# Patient Record
Sex: Female | Born: 1965 | Race: White | Hispanic: No | Marital: Married | Smoking: Never smoker
Health system: Southern US, Community
[De-identification: ages and names within clinical notes are randomized; demographics above are authoritative.]

## PROBLEM LIST (undated history)

## (undated) DIAGNOSIS — F419 Anxiety disorder, unspecified: Secondary | ICD-10-CM

## (undated) DIAGNOSIS — T7840XA Allergy, unspecified, initial encounter: Secondary | ICD-10-CM

## (undated) DIAGNOSIS — T8859XA Other complications of anesthesia, initial encounter: Secondary | ICD-10-CM

## (undated) DIAGNOSIS — F329 Major depressive disorder, single episode, unspecified: Secondary | ICD-10-CM

## (undated) DIAGNOSIS — R112 Nausea with vomiting, unspecified: Secondary | ICD-10-CM

## (undated) DIAGNOSIS — J45909 Unspecified asthma, uncomplicated: Secondary | ICD-10-CM

## (undated) DIAGNOSIS — Z9889 Other specified postprocedural states: Secondary | ICD-10-CM

## (undated) DIAGNOSIS — R011 Cardiac murmur, unspecified: Secondary | ICD-10-CM

## (undated) DIAGNOSIS — F32A Depression, unspecified: Secondary | ICD-10-CM

## (undated) DIAGNOSIS — T4145XA Adverse effect of unspecified anesthetic, initial encounter: Secondary | ICD-10-CM

## (undated) HISTORY — DX: Allergy, unspecified, initial encounter: T78.40XA

## (undated) HISTORY — DX: Cardiac murmur, unspecified: R01.1

## (undated) HISTORY — DX: Major depressive disorder, single episode, unspecified: F32.9

## (undated) HISTORY — PX: EYE SURGERY: SHX253

## (undated) HISTORY — DX: Anxiety disorder, unspecified: F41.9

## (undated) HISTORY — DX: Unspecified asthma, uncomplicated: J45.909

## (undated) HISTORY — DX: Depression, unspecified: F32.A

## (undated) HISTORY — PX: APPENDECTOMY: SHX54

---

## 1999-04-15 ENCOUNTER — Other Ambulatory Visit: Admission: RE | Admit: 1999-04-15 | Discharge: 1999-04-15 | Payer: Self-pay | Admitting: Obstetrics & Gynecology

## 2000-05-19 ENCOUNTER — Other Ambulatory Visit: Admission: RE | Admit: 2000-05-19 | Discharge: 2000-05-19 | Payer: Self-pay | Admitting: Obstetrics & Gynecology

## 2001-06-09 ENCOUNTER — Other Ambulatory Visit: Admission: RE | Admit: 2001-06-09 | Discharge: 2001-06-09 | Payer: Self-pay | Admitting: Obstetrics & Gynecology

## 2002-06-14 ENCOUNTER — Other Ambulatory Visit: Admission: RE | Admit: 2002-06-14 | Discharge: 2002-06-14 | Payer: Self-pay | Admitting: Obstetrics & Gynecology

## 2003-06-20 ENCOUNTER — Other Ambulatory Visit: Admission: RE | Admit: 2003-06-20 | Discharge: 2003-06-20 | Payer: Self-pay | Admitting: Obstetrics & Gynecology

## 2004-06-30 ENCOUNTER — Other Ambulatory Visit: Admission: RE | Admit: 2004-06-30 | Discharge: 2004-06-30 | Payer: Self-pay | Admitting: Obstetrics & Gynecology

## 2004-08-15 ENCOUNTER — Ambulatory Visit (HOSPITAL_COMMUNITY): Admission: RE | Admit: 2004-08-15 | Discharge: 2004-08-15 | Payer: Self-pay | Admitting: Internal Medicine

## 2005-07-01 ENCOUNTER — Other Ambulatory Visit: Admission: RE | Admit: 2005-07-01 | Discharge: 2005-07-01 | Payer: Self-pay | Admitting: Obstetrics & Gynecology

## 2009-11-21 ENCOUNTER — Ambulatory Visit (HOSPITAL_COMMUNITY): Admission: RE | Admit: 2009-11-21 | Discharge: 2009-11-21 | Payer: Self-pay | Admitting: Internal Medicine

## 2012-08-21 ENCOUNTER — Emergency Department (HOSPITAL_COMMUNITY): Payer: No Typology Code available for payment source

## 2012-08-21 ENCOUNTER — Encounter (HOSPITAL_COMMUNITY): Payer: Self-pay | Admitting: Radiology

## 2012-08-21 ENCOUNTER — Emergency Department (HOSPITAL_COMMUNITY)
Admission: EM | Admit: 2012-08-21 | Discharge: 2012-08-21 | Disposition: A | Discharge status: Home / Self Care | Payer: No Typology Code available for payment source | Attending: Emergency Medicine | Admitting: Emergency Medicine

## 2012-08-21 DIAGNOSIS — S239XXA Sprain of unspecified parts of thorax, initial encounter: Secondary | ICD-10-CM

## 2012-08-21 DIAGNOSIS — R0602 Shortness of breath: Secondary | ICD-10-CM | POA: Insufficient documentation

## 2012-08-21 DIAGNOSIS — M549 Dorsalgia, unspecified: Secondary | ICD-10-CM | POA: Insufficient documentation

## 2012-08-21 LAB — URINALYSIS, ROUTINE W REFLEX MICROSCOPIC
Bilirubin Urine: NEGATIVE
Glucose, UA: NEGATIVE mg/dL
Hgb urine dipstick: NEGATIVE
Ketones, ur: 15 mg/dL — AB
Leukocytes, UA: NEGATIVE
Nitrite: NEGATIVE
Protein, ur: NEGATIVE mg/dL
Specific Gravity, Urine: 1.023 (ref 1.005–1.030)
Urobilinogen, UA: 1 mg/dL (ref 0.0–1.0)
pH: 6 (ref 5.0–8.0)

## 2012-08-21 MED ORDER — DIAZEPAM 5 MG PO TABS
5.0000 mg | ORAL_TABLET | Freq: Once | ORAL | Status: AC
  Administered 2012-08-21: 5 mg via ORAL
  Filled 2012-08-21: qty 1

## 2012-08-21 MED ORDER — HYDROCODONE-ACETAMINOPHEN 5-325 MG PO TABS
1.0000 | ORAL_TABLET | Freq: Once | ORAL | Status: AC
  Administered 2012-08-21: 1 via ORAL
  Filled 2012-08-21: qty 1

## 2012-08-21 MED ORDER — IBUPROFEN 600 MG PO TABS: 600.0000 mg | ORAL_TABLET | Freq: Three times a day (TID) | ORAL | Status: AC

## 2012-08-21 MED ORDER — HYDROCODONE-ACETAMINOPHEN 5-325 MG PO TABS: ORAL_TABLET | ORAL | Status: AC

## 2012-08-21 NOTE — Discharge Instructions (Signed)
 Thoracic Strain You have injured the muscles or tendons that attach to the upper part of your back behind your chest. This injury is called a thoracic strain, thoracic sprain, or mid-back strain.  CAUSES  The cause of thoracic strain varies. A less severe injury involves pulling a muscle or tendon without tearing it. A more severe injury involves tearing (rupturing) a muscle or tendon. With less severe injuries, there may be little loss of strength. Sometimes, there are breaks (fractures) in the bones to which the muscles are attached. These fractures are rare, unless there was a direct hit (trauma) or you have weak bones due to osteoporosis or age. Longstanding strains may be caused by overuse or improper form during certain movements. Obesity can also increase your risk for back injuries. Sudden strains may occur due to injury or not warming up properly before exercise. Often, there is no obvious cause for a thoracic strain. SYMPTOMS  The main symptom is pain, especially with movement, such as during exercise. DIAGNOSIS  Your caregiver can usually tell what is wrong by taking an X-ray and doing a physical exam. TREATMENT   Physical therapy may be helpful for recovery. Your caregiver can give you exercises to do or refer you to a physical therapist after your pain improves.  After your pain improves, strengthening and conditioning programs appropriate for your sport or occupation may be helpful.  Always warm up before physical activities or athletics. Stretching after physical activity may also help.  Certain over-the-counter medicines may also help. Ask your caregiver if there are medicines that would help you. If this is your first thoracic strain injury, proper care and proper healing time before starting activities should prevent long-term problems. Torn ligaments and tendons require as long to heal as broken bones. Average healing times may be only 1 week for a mild strain. For torn muscles  and tendons, healing time may be up to 6 weeks to 2 months. HOME CARE INSTRUCTIONS   Apply ice to the injured area. Ice massages may also be used as directed.  Put ice in a plastic bag.  Place a towel between your skin and the bag.  Leave the ice on for 15 to 20 minutes, 3 to 4 times a day, for the first 2 days.  Only take over-the-counter or prescription medicines for pain, discomfort, or fever as directed by your caregiver.  Keep your appointments for physical therapy if this was prescribed.  Use wraps and back braces as instructed. SEEK IMMEDIATE MEDICAL CARE IF:   You have an increase in bruising, swelling, or pain.  Your pain has not improved with medicines.  You develop new shortness of breath, chest pain, or fever.  Problems seem to be getting worse rather than better. MAKE SURE YOU:   Understand these instructions.  Will watch your condition.  Will get help right away if you are not doing well or get worse. Document Released: 01/09/2004 Document Revised: 01/11/2012 Document Reviewed: 12/05/2010 [facility] Patient Information 2013 [place], [place].   Narcotic and benzodiazepine use may cause drowsiness, slowed breathing or dependence.  Please use with caution and do not drive, operate machinery or watch young children alone while taking them.  Taking combinations of these medications or drinking alcohol will potentiate these effects.
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
## 2014-06-29 ENCOUNTER — Other Ambulatory Visit (HOSPITAL_COMMUNITY): Payer: Self-pay | Admitting: Internal Medicine

## 2014-06-29 ENCOUNTER — Ambulatory Visit (INDEPENDENT_AMBULATORY_CARE_PROVIDER_SITE_OTHER): Payer: BC Managed Care – PPO | Admitting: Internal Medicine

## 2014-06-29 ENCOUNTER — Ambulatory Visit (HOSPITAL_COMMUNITY)
Admission: RE | Admit: 2014-06-29 | Discharge: 2014-06-29 | Disposition: A | Discharge status: Home / Self Care | Payer: BC Managed Care – PPO | Source: Ambulatory Visit | Attending: Internal Medicine | Admitting: Internal Medicine

## 2014-06-29 ENCOUNTER — Ambulatory Visit (INDEPENDENT_AMBULATORY_CARE_PROVIDER_SITE_OTHER): Payer: BC Managed Care – PPO

## 2014-06-29 VITALS — BP 128/86 | HR 95 | Temp 98.8°F | Resp 16 | Ht 62.5 in | Wt 205.2 lb

## 2014-06-29 DIAGNOSIS — M79605 Pain in left leg: Secondary | ICD-10-CM

## 2014-06-29 DIAGNOSIS — M25562 Pain in left knee: Secondary | ICD-10-CM

## 2014-06-29 DIAGNOSIS — M25569 Pain in unspecified knee: Secondary | ICD-10-CM

## 2014-06-29 DIAGNOSIS — M79609 Pain in unspecified limb: Secondary | ICD-10-CM | POA: Insufficient documentation

## 2014-06-29 DIAGNOSIS — R609 Edema, unspecified: Secondary | ICD-10-CM

## 2014-06-29 MED ORDER — HYDROCODONE-ACETAMINOPHEN 5-325 MG PO TABS: 1.0000 | ORAL_TABLET | Freq: Four times a day (QID) | ORAL | Status: AC | PRN

## 2014-06-29 NOTE — Progress Notes (Signed)
VASCULAR LAB PRELIMINARY  PRELIMINARY  PRELIMINARY  PRELIMINARY  Left lower extremity venous duplex completed.    Preliminary report:  Left:  No evidence of DVT, superficial thrombosis, or Baker's cyst.  Deniah Saia, RVS 06/29/2014, 1:59 PM

## 2014-06-29 NOTE — Patient Instructions (Signed)

## 2014-06-29 NOTE — Progress Notes (Signed)
   Subjective:    Patient ID: Stacy Fuentes, female    DOB: 1965/12/03, 48 y.o.   MRN: [id number]  HPI 48 yr old Caucasian female is here today with left leg pain for the past week and is getting progressively worse. She states she feels pain in her thigh and now in her calf. She states she does having swelling in her left foot and ankle as well. She states she has not fallen or injuried herself and does not have any history of DVTs. She rates the pain about a 6 out of 10 and it is now uncomfortable to sleep. Denies SOB, numbness, tingling, loss of balance. Has no back pain or hx of any low back issues. Feels like her veins are swollen on left. No fhx of dvt or pe Has been sitting in a conference for couple of days. Has no cough, sob, cp, or hemoptysis  Review of Systems     Objective:   Physical Exam  Constitutional: She is oriented to person, place, and time. She appears well-developed and well-nourished.  HENT:  Head: Normocephalic.  Eyes: EOM are normal. Pupils are equal, round, and reactive to light.  Neck: Normal range of motion. Neck supple.  Cardiovascular: Normal rate.   Pulmonary/Chest: Effort normal.  Neurological: She is alert and oriented to person, place, and time. She exhibits normal muscle tone. Coordination normal.  Psychiatric: She has a normal mood and affect. Her behavior is normal. Thought content normal.     Left thigh 53cm, right 54cm Left calf 40cm, right calf 40cm  [facility] reading (PRIMARY) by  Dr.Guest. Normal leg xr      Assessment & Plan:  Left leg pain and swelling/ro dvt/Schedule doppler today RO DVT Vicodin prn pain

## 2014-07-02 ENCOUNTER — Ambulatory Visit (HOSPITAL_COMMUNITY): Payer: BC Managed Care – PPO | Attending: Internal Medicine

## 2014-07-03 ENCOUNTER — Telehealth: Payer: Self-pay

## 2014-07-03 NOTE — Telephone Encounter (Signed)
Patient was seen recently by Dr. Elder Cyphers and was sent for a venous doppler. Was calling for results. Call back number during the day is her cell at [phone].

## 2014-07-10 NOTE — Telephone Encounter (Signed)
Doppler was normal, have her return if not well.

## 2014-07-11 NOTE — Telephone Encounter (Signed)
Advised pt of results and she states that she is still having pain.  Advised pt that she should RTC.  She will go to her PCP.  Doppler results faxed to PCP Dr. Maxwell Caul at [place].

## 2014-11-29 ENCOUNTER — Other Ambulatory Visit: Payer: Self-pay | Admitting: Nurse Practitioner

## 2014-11-29 ENCOUNTER — Ambulatory Visit
Admission: RE | Admit: 2014-11-29 | Discharge: 2014-11-29 | Disposition: A | Discharge status: Home / Self Care | Payer: BLUE CROSS/BLUE SHIELD | Source: Ambulatory Visit | Attending: Nurse Practitioner | Admitting: Nurse Practitioner

## 2014-11-29 DIAGNOSIS — E039 Hypothyroidism, unspecified: Secondary | ICD-10-CM

## 2014-12-03 ENCOUNTER — Other Ambulatory Visit: Payer: Self-pay | Admitting: Gastroenterology

## 2014-12-03 ENCOUNTER — Ambulatory Visit
Admission: RE | Admit: 2014-12-03 | Discharge: 2014-12-03 | Disposition: A | Discharge status: Home / Self Care | Payer: BLUE CROSS/BLUE SHIELD | Source: Ambulatory Visit | Attending: Gastroenterology | Admitting: Gastroenterology

## 2014-12-03 DIAGNOSIS — R1310 Dysphagia, unspecified: Secondary | ICD-10-CM

## 2015-03-13 ENCOUNTER — Other Ambulatory Visit (HOSPITAL_COMMUNITY): Payer: Self-pay | Admitting: Respiratory Therapy

## 2015-03-13 DIAGNOSIS — J4520 Mild intermittent asthma, uncomplicated: Secondary | ICD-10-CM

## 2015-03-25 ENCOUNTER — Ambulatory Visit (HOSPITAL_COMMUNITY)
Admission: RE | Admit: 2015-03-25 | Discharge: 2015-03-25 | Disposition: A | Discharge status: Home / Self Care | Payer: BLUE CROSS/BLUE SHIELD | Source: Ambulatory Visit | Attending: Internal Medicine | Admitting: Internal Medicine

## 2015-03-25 DIAGNOSIS — J4520 Mild intermittent asthma, uncomplicated: Secondary | ICD-10-CM | POA: Diagnosis not present

## 2015-03-25 LAB — PULMONARY FUNCTION TEST
DL/VA % pred: 123 %
DL/VA: 5.78 ml/min/mmHg/L
DLCO UNC % PRED: 88 %
DLCO UNC: 20.32 ml/min/mmHg
FEF 25-75 PRE: 2.24 L/s
FEF2575-%Pred-Pre: 84 %
FEV1-%Pred-Pre: 80 %
FEV1-Pre: 2.18 L
FEV1FVC-%Pred-Pre: 99 %
FEV6-%PRED-PRE: 82 %
FEV6-Pre: 2.72 L
FEV6FVC-%Pred-Pre: 102 %
FVC-%Pred-Pre: 80 %
FVC-PRE: 2.72 L
PRE FEV1/FVC RATIO: 80 %
Pre FEV6/FVC Ratio: 100 %
RV % PRED: 105 %
RV: 1.86 L
TLC % PRED: 94 %
TLC: 4.64 L

## 2016-07-30 ENCOUNTER — Other Ambulatory Visit: Payer: Self-pay | Admitting: Gastroenterology

## 2016-09-07 NOTE — Progress Notes (Signed)
09-07-16 1400 No returned calls from pt with attempts to reach for pre procedure instructions and history.

## 2016-09-08 ENCOUNTER — Ambulatory Visit (HOSPITAL_COMMUNITY)
Admission: RE | Admit: 2016-09-08 | Discharge: 2016-09-08 | Disposition: A | Discharge status: Home / Self Care | Payer: BLUE CROSS/BLUE SHIELD | Source: Ambulatory Visit | Attending: Gastroenterology | Admitting: Gastroenterology

## 2016-09-08 ENCOUNTER — Encounter (HOSPITAL_COMMUNITY)
Admission: RE | Disposition: A | Discharge status: Home / Self Care | Payer: Self-pay | Source: Ambulatory Visit | Attending: Gastroenterology

## 2016-09-08 ENCOUNTER — Ambulatory Visit (HOSPITAL_COMMUNITY): Payer: BLUE CROSS/BLUE SHIELD | Admitting: Registered Nurse

## 2016-09-08 ENCOUNTER — Encounter (HOSPITAL_COMMUNITY): Payer: Self-pay | Admitting: *Deleted

## 2016-09-08 DIAGNOSIS — Z7951 Long term (current) use of inhaled steroids: Secondary | ICD-10-CM | POA: Diagnosis not present

## 2016-09-08 DIAGNOSIS — F418 Other specified anxiety disorders: Secondary | ICD-10-CM | POA: Insufficient documentation

## 2016-09-08 DIAGNOSIS — E785 Hyperlipidemia, unspecified: Secondary | ICD-10-CM | POA: Diagnosis not present

## 2016-09-08 DIAGNOSIS — Z1211 Encounter for screening for malignant neoplasm of colon: Secondary | ICD-10-CM | POA: Insufficient documentation

## 2016-09-08 DIAGNOSIS — Z79899 Other long term (current) drug therapy: Secondary | ICD-10-CM | POA: Insufficient documentation

## 2016-09-08 DIAGNOSIS — Z791 Long term (current) use of non-steroidal anti-inflammatories (NSAID): Secondary | ICD-10-CM | POA: Insufficient documentation

## 2016-09-08 DIAGNOSIS — Z793 Long term (current) use of hormonal contraceptives: Secondary | ICD-10-CM | POA: Diagnosis not present

## 2016-09-08 DIAGNOSIS — J45909 Unspecified asthma, uncomplicated: Secondary | ICD-10-CM | POA: Diagnosis not present

## 2016-09-08 DIAGNOSIS — Z6834 Body mass index (BMI) 34.0-34.9, adult: Secondary | ICD-10-CM | POA: Insufficient documentation

## 2016-09-08 DIAGNOSIS — D128 Benign neoplasm of rectum: Secondary | ICD-10-CM | POA: Diagnosis not present

## 2016-09-08 DIAGNOSIS — E669 Obesity, unspecified: Secondary | ICD-10-CM | POA: Diagnosis not present

## 2016-09-08 DIAGNOSIS — E7800 Pure hypercholesterolemia, unspecified: Secondary | ICD-10-CM | POA: Insufficient documentation

## 2016-09-08 HISTORY — DX: Nausea with vomiting, unspecified: R11.2

## 2016-09-08 HISTORY — DX: Adverse effect of unspecified anesthetic, initial encounter: T41.45XA

## 2016-09-08 HISTORY — PX: COLONOSCOPY WITH PROPOFOL: SHX5780

## 2016-09-08 HISTORY — DX: Other complications of anesthesia, initial encounter: T88.59XA

## 2016-09-08 HISTORY — DX: Other specified postprocedural states: Z98.890

## 2016-09-08 SURGERY — COLONOSCOPY WITH PROPOFOL
Anesthesia: Monitor Anesthesia Care

## 2016-09-08 MED ORDER — ONDANSETRON HCL 4 MG/2ML IJ SOLN
INTRAMUSCULAR | Status: AC
  Filled 2016-09-08: qty 2

## 2016-09-08 MED ORDER — LIDOCAINE 2% (20 MG/ML) 5 ML SYRINGE
INTRAMUSCULAR | Status: AC
  Filled 2016-09-08: qty 5

## 2016-09-08 MED ORDER — ONDANSETRON HCL 4 MG/2ML IJ SOLN
INTRAMUSCULAR | Status: DC | PRN
  Administered 2016-09-08: 4 mg via INTRAVENOUS

## 2016-09-08 MED ORDER — LACTATED RINGERS IV SOLN
INTRAVENOUS | Status: DC
  Administered 2016-09-08: 1000 mL via INTRAVENOUS

## 2016-09-08 MED ORDER — SODIUM CHLORIDE 0.9 % IV SOLN: INTRAVENOUS | Status: DC

## 2016-09-08 MED ORDER — PROPOFOL 10 MG/ML IV BOLUS
INTRAVENOUS | Status: DC | PRN
  Administered 2016-09-08 (×2): 20 mg via INTRAVENOUS
  Administered 2016-09-08: 40 mg via INTRAVENOUS

## 2016-09-08 MED ORDER — PROPOFOL 10 MG/ML IV BOLUS
INTRAVENOUS | Status: AC
  Filled 2016-09-08: qty 60

## 2016-09-08 MED ORDER — PROPOFOL 500 MG/50ML IV EMUL
INTRAVENOUS | Status: DC | PRN
  Administered 2016-09-08: 100 ug/kg/min via INTRAVENOUS

## 2016-09-08 MED ORDER — LIDOCAINE 2% (20 MG/ML) 5 ML SYRINGE
INTRAMUSCULAR | Status: DC | PRN
  Administered 2016-09-08: 100 mg via INTRAVENOUS

## 2016-09-08 SURGICAL SUPPLY — 22 items

## 2016-09-08 NOTE — Transfer of Care (Signed)
Immediate Anesthesia Transfer of Care Note  Patient: Stacy Fuentes  Procedure(s) Performed: Procedure(s): COLONOSCOPY WITH PROPOFOL (N/A)  Patient Location: PACU  Anesthesia Type:MAC  Level of Consciousness:  sedated, patient cooperative and responds to stimulation  Airway & Oxygen Therapy:Patient Spontanous Breathing and Patient connected to face mask oxgen  Post-op Assessment:  Report given to PACU RN and Post -op Vital signs reviewed and stable  Post vital signs:  Reviewed and stable  Last Vitals:  Vitals:   09/08/16 1024  BP: (!) 148/78  Resp: (!) 22  Temp: 123XX123 C    Complications: No apparent anesthesia complications

## 2016-09-08 NOTE — Op Note (Signed)
[facility] Patient Name: Stacy Fuentes Procedure Date: 09/08/2016 MRN: [id number] Attending MD: Garlan Fair , MD Date of Birth: 09/09/1966 CSN: [id number] Age: 50 Admit Type: Outpatient Procedure:                Colonoscopy Indications:              Screening for colorectal malignant neoplasm Providers:                Garlan Fair, MD, Vista Lawman, RN, Cherylynn Ridges, Technician, Marla Roe, CRNA Referring MD:              Medicines:                Propofol per Anesthesia Complications:            No immediate complications. Estimated Blood Loss:     Estimated blood loss: none. Procedure:                Pre-Anesthesia Assessment:                           - Prior to the procedure, a History and Physical                            was performed, and patient medications and                            allergies were reviewed. The patient's tolerance of                            previous anesthesia was also reviewed. The risks                            and benefits of the procedure and the sedation                            options and risks were discussed with the patient.                            All questions were answered, and informed consent                            was obtained. Prior Anticoagulants: The patient has                            taken no previous anticoagulant or antiplatelet                            agents. ASA Grade Assessment: II - A patient with                            mild systemic disease. After reviewing the risks  and benefits, the patient was deemed in                            satisfactory condition to undergo the procedure.                           After obtaining informed consent, the colonoscope                            was passed under direct vision. Throughout the                            procedure, the patient's blood pressure, pulse, and                             oxygen saturations were monitored continuously. The                            [id number] scope was introduced through                            the anus and advanced to the the cecum, identified                            by appendiceal orifice and ileocecal valve. The                            colonoscopy was performed without difficulty. The                            patient tolerated the procedure well. The quality                            of the bowel preparation was good. The appendiceal                            orifice and the rectum were photographed. Scope In: 10:38:45 AM Scope Out: 10:58:14 AM Scope Withdrawal Time: 0 hours 12 minutes 53 seconds  Total Procedure Duration: 0 hours 19 minutes 29 seconds  Findings:      The perianal and digital rectal examinations were normal.      A 5 mm polyp was found in the rectum. The polyp was sessile. The polyp       was removed with a cold snare. Resection and retrieval were complete.      The exam was otherwise without abnormality. Impression:               - One 5 mm polyp in the rectum, removed with a cold                            snare. Resected and retrieved.                           - The examination was otherwise normal. Moderate Sedation:      N/A-  Per Anesthesia Care Recommendation:           - Patient has a contact number available for                            emergencies. The signs and symptoms of potential                            delayed complications were discussed with the                            patient. Return to normal activities tomorrow.                            Written discharge instructions were provided to the                            patient.                           - Repeat colonoscopy date to be determined after                            pending pathology results are reviewed for                            surveillance.                           - Resume previous  diet.                           - Continue present medications. Procedure Code(s):        --- Professional ---                           [phone], Colonoscopy, flexible; with removal of                            tumor(s), polyp(s), or other lesion(s) by snare                            technique Diagnosis Code(s):        --- Professional ---                           Z12.11, Encounter for screening for malignant                            neoplasm of colon                           K62.1, Rectal polyp CPT copyright 2016 [employer]. All rights reserved. The codes documented in this report are preliminary and upon coder review may  be revised to meet current compliance requirements. Earle Gell, MD Garlan Fair, MD 09/08/2016 11:04:57 AM This report has been signed electronically. Number of Addenda: 0

## 2016-09-08 NOTE — Discharge Instructions (Signed)

## 2016-09-08 NOTE — H&P (Signed)
Procedure: Screening colonoscopy. Normal screening colonoscopy was performed on 07/26/2006  History: The patient is a 50 year old female born [birth year]. She is scheduled to undergo a repeat screening colonoscopy today.  Past medical history: Tonsillectomy. Shoulder surgery. Appendectomy. Tear duct surgery. Right rotator cuff repair surgery.Eye surgeries. Constipation predominant irritable bowel syndrome. Osteoarthritis. Anxiety with depression. Hypercholesterolemia. Asthma and allergic rhinitis.  Medication allergies: Codeine caused nausea and vomiting. Lipitor caused myalgia.  Exam: The patient is alert and lying comfortably on the endoscopy stretcher. Abdomen is soft and nontender to palpation. Lungs are clear to auscultation. Cardiac exam reveals a regular rhythm.  Plan: Proceed with screening colonoscopy

## 2016-09-08 NOTE — Anesthesia Preprocedure Evaluation (Signed)
Anesthesia Evaluation  Patient identified by MRN, date of birth, ID band Patient awake    Reviewed: Allergy & Precautions  History of Anesthesia Complications (+) PONV and history of anesthetic complications  Airway Mallampati: II       Dental no notable dental hx. (+) Teeth Intact   Pulmonary asthma ,    Pulmonary exam normal breath sounds clear to auscultation       Cardiovascular Exercise Tolerance: Good negative cardio ROS Normal cardiovascular exam+ Valvular Problems/Murmurs  Rhythm:Regular Rate:Normal     Neuro/Psych PSYCHIATRIC DISORDERS Anxiety Depression negative neurological ROS     GI/Hepatic Neg liver ROS, For Screening Colonoscopy   Endo/Other  Hyperlipidemia Obesity  Renal/GU negative Renal ROS  negative genitourinary   Musculoskeletal negative musculoskeletal ROS (+)   Abdominal (+) + obese,   Peds  Hematology negative hematology ROS (+)   Anesthesia Other Findings   Reproductive/Obstetrics                               Chemistry    No results found for: CALCIUM, ALKPHOS, AST, ALT, BILITOT  Anesthesia Physical Anesthesia Plan  ASA: II  Anesthesia Plan: MAC   Post-op Pain Management:    Induction: Intravenous  Airway Management Planned: Natural Airway, Simple Face Mask and Nasal Cannula  Additional Equipment:   Intra-op Plan:   Post-operative Plan:   Informed Consent: I have reviewed the patients History and Physical, chart, labs and discussed the procedure including the risks, benefits and alternatives for the proposed anesthesia with the patient or authorized representative who has indicated his/her understanding and acceptance.   Dental advisory given  Plan Discussed with: Anesthesiologist, CRNA and Surgeon  Anesthesia Plan Comments:         Anesthesia Quick Evaluation

## 2016-09-08 NOTE — Anesthesia Postprocedure Evaluation (Signed)
Anesthesia Post Note  Patient: Chevette Dannelly Ayala  Procedure(s) Performed: Procedure(s) (LRB): COLONOSCOPY WITH PROPOFOL (N/A)  Patient location during evaluation: PACU Anesthesia Type: MAC Level of consciousness: awake and alert and oriented Pain management: pain level controlled Vital Signs Assessment: post-procedure vital signs reviewed and stable Respiratory status: spontaneous breathing, nonlabored ventilation and respiratory function stable Cardiovascular status: stable and blood pressure returned to baseline Postop Assessment: no signs of nausea or vomiting Anesthetic complications: no    Last Vitals:  Vitals:   09/08/16 1024 09/08/16 1105  BP: (!) 148/78 134/61  Pulse:  81  Resp: (!) 22 15  Temp: 37.3 C     Last Pain:  Vitals:   09/08/16 1105  TempSrc: Oral                 Irena Gaydos A.

## 2016-09-09 ENCOUNTER — Encounter (HOSPITAL_COMMUNITY): Payer: Self-pay | Admitting: Gastroenterology

## 2016-11-06 ENCOUNTER — Other Ambulatory Visit (INDEPENDENT_AMBULATORY_CARE_PROVIDER_SITE_OTHER): Payer: Self-pay | Admitting: Otolaryngology

## 2016-11-06 DIAGNOSIS — J329 Chronic sinusitis, unspecified: Secondary | ICD-10-CM

## 2016-11-10 ENCOUNTER — Ambulatory Visit
Admission: RE | Admit: 2016-11-10 | Discharge: 2016-11-10 | Disposition: A | Discharge status: Home / Self Care | Payer: BLUE CROSS/BLUE SHIELD | Source: Ambulatory Visit | Attending: Otolaryngology | Admitting: Otolaryngology

## 2016-11-10 DIAGNOSIS — J329 Chronic sinusitis, unspecified: Secondary | ICD-10-CM

## 2016-12-14 ENCOUNTER — Other Ambulatory Visit: Payer: Self-pay | Admitting: Obstetrics & Gynecology

## 2016-12-16 LAB — CYTOLOGY - PAP

## 2017-05-31 ENCOUNTER — Other Ambulatory Visit: Payer: Self-pay | Admitting: Orthopedic Surgery

## 2017-05-31 DIAGNOSIS — M25511 Pain in right shoulder: Secondary | ICD-10-CM

## 2017-06-16 ENCOUNTER — Ambulatory Visit
Admission: RE | Admit: 2017-06-16 | Discharge: 2017-06-16 | Disposition: A | Discharge status: Home / Self Care | Payer: BLUE CROSS/BLUE SHIELD | Source: Ambulatory Visit | Attending: Orthopedic Surgery | Admitting: Orthopedic Surgery

## 2017-06-16 ENCOUNTER — Encounter: Payer: Self-pay | Admitting: Radiology

## 2017-06-16 DIAGNOSIS — M25511 Pain in right shoulder: Secondary | ICD-10-CM

## 2017-06-16 MED ORDER — METHYLPREDNISOLONE ACETATE 40 MG/ML INJ SUSP (RADIOLOG
120.0000 mg | Freq: Once | INTRAMUSCULAR | Status: AC
  Administered 2017-06-16: 120 mg via INTRA_ARTICULAR

## 2017-06-16 MED ORDER — IOPAMIDOL (ISOVUE-M 200) INJECTION 41%
20.0000 mL | Freq: Once | INTRAMUSCULAR | Status: AC
  Administered 2017-06-16: 20 mL via INTRA_ARTICULAR

## 2017-09-02 DIAGNOSIS — M7541 Impingement syndrome of right shoulder: Secondary | ICD-10-CM | POA: Diagnosis not present

## 2017-09-02 DIAGNOSIS — M25511 Pain in right shoulder: Secondary | ICD-10-CM | POA: Diagnosis not present

## 2017-09-02 DIAGNOSIS — S46011D Strain of muscle(s) and tendon(s) of the rotator cuff of right shoulder, subsequent encounter: Secondary | ICD-10-CM | POA: Diagnosis not present

## 2017-09-02 DIAGNOSIS — M25611 Stiffness of right shoulder, not elsewhere classified: Secondary | ICD-10-CM | POA: Diagnosis not present

## 2017-09-06 DIAGNOSIS — M7541 Impingement syndrome of right shoulder: Secondary | ICD-10-CM | POA: Diagnosis not present

## 2017-09-06 DIAGNOSIS — S46011D Strain of muscle(s) and tendon(s) of the rotator cuff of right shoulder, subsequent encounter: Secondary | ICD-10-CM | POA: Diagnosis not present

## 2017-09-06 DIAGNOSIS — M25511 Pain in right shoulder: Secondary | ICD-10-CM | POA: Diagnosis not present

## 2017-09-06 DIAGNOSIS — M25611 Stiffness of right shoulder, not elsewhere classified: Secondary | ICD-10-CM | POA: Diagnosis not present

## 2017-09-09 DIAGNOSIS — M25511 Pain in right shoulder: Secondary | ICD-10-CM | POA: Diagnosis not present

## 2017-09-09 DIAGNOSIS — M7541 Impingement syndrome of right shoulder: Secondary | ICD-10-CM | POA: Diagnosis not present

## 2017-09-09 DIAGNOSIS — M25611 Stiffness of right shoulder, not elsewhere classified: Secondary | ICD-10-CM | POA: Diagnosis not present

## 2017-09-09 DIAGNOSIS — S46011D Strain of muscle(s) and tendon(s) of the rotator cuff of right shoulder, subsequent encounter: Secondary | ICD-10-CM | POA: Diagnosis not present

## 2017-09-13 DIAGNOSIS — S46011D Strain of muscle(s) and tendon(s) of the rotator cuff of right shoulder, subsequent encounter: Secondary | ICD-10-CM | POA: Diagnosis not present

## 2017-09-13 DIAGNOSIS — M7541 Impingement syndrome of right shoulder: Secondary | ICD-10-CM | POA: Diagnosis not present

## 2017-09-13 DIAGNOSIS — M25511 Pain in right shoulder: Secondary | ICD-10-CM | POA: Diagnosis not present

## 2017-09-13 DIAGNOSIS — M25611 Stiffness of right shoulder, not elsewhere classified: Secondary | ICD-10-CM | POA: Diagnosis not present

## 2017-09-16 DIAGNOSIS — M25611 Stiffness of right shoulder, not elsewhere classified: Secondary | ICD-10-CM | POA: Diagnosis not present

## 2017-09-16 DIAGNOSIS — M25511 Pain in right shoulder: Secondary | ICD-10-CM | POA: Diagnosis not present

## 2017-09-16 DIAGNOSIS — S46011D Strain of muscle(s) and tendon(s) of the rotator cuff of right shoulder, subsequent encounter: Secondary | ICD-10-CM | POA: Diagnosis not present

## 2017-09-16 DIAGNOSIS — M7541 Impingement syndrome of right shoulder: Secondary | ICD-10-CM | POA: Diagnosis not present

## 2017-09-20 DIAGNOSIS — M7541 Impingement syndrome of right shoulder: Secondary | ICD-10-CM | POA: Diagnosis not present

## 2017-09-20 DIAGNOSIS — M25611 Stiffness of right shoulder, not elsewhere classified: Secondary | ICD-10-CM | POA: Diagnosis not present

## 2017-09-20 DIAGNOSIS — S46011D Strain of muscle(s) and tendon(s) of the rotator cuff of right shoulder, subsequent encounter: Secondary | ICD-10-CM | POA: Diagnosis not present

## 2017-09-20 DIAGNOSIS — M25511 Pain in right shoulder: Secondary | ICD-10-CM | POA: Diagnosis not present

## 2017-10-18 DIAGNOSIS — S46011D Strain of muscle(s) and tendon(s) of the rotator cuff of right shoulder, subsequent encounter: Secondary | ICD-10-CM | POA: Diagnosis not present

## 2017-11-04 DIAGNOSIS — L720 Epidermal cyst: Secondary | ICD-10-CM | POA: Diagnosis not present

## 2017-11-04 DIAGNOSIS — D2261 Melanocytic nevi of right upper limb, including shoulder: Secondary | ICD-10-CM | POA: Diagnosis not present

## 2017-11-04 DIAGNOSIS — D485 Neoplasm of uncertain behavior of skin: Secondary | ICD-10-CM | POA: Diagnosis not present

## 2017-11-04 DIAGNOSIS — L570 Actinic keratosis: Secondary | ICD-10-CM | POA: Diagnosis not present

## 2017-11-04 DIAGNOSIS — D1801 Hemangioma of skin and subcutaneous tissue: Secondary | ICD-10-CM | POA: Diagnosis not present

## 2017-11-04 DIAGNOSIS — L814 Other melanin hyperpigmentation: Secondary | ICD-10-CM | POA: Diagnosis not present

## 2017-12-16 DIAGNOSIS — F988 Other specified behavioral and emotional disorders with onset usually occurring in childhood and adolescence: Secondary | ICD-10-CM | POA: Diagnosis not present

## 2017-12-16 DIAGNOSIS — E785 Hyperlipidemia, unspecified: Secondary | ICD-10-CM | POA: Diagnosis not present

## 2017-12-16 DIAGNOSIS — F418 Other specified anxiety disorders: Secondary | ICD-10-CM | POA: Diagnosis not present

## 2017-12-16 DIAGNOSIS — J4520 Mild intermittent asthma, uncomplicated: Secondary | ICD-10-CM | POA: Diagnosis not present

## 2018-03-07 DIAGNOSIS — Z1231 Encounter for screening mammogram for malignant neoplasm of breast: Secondary | ICD-10-CM | POA: Diagnosis not present

## 2018-03-07 DIAGNOSIS — Z6835 Body mass index (BMI) 35.0-35.9, adult: Secondary | ICD-10-CM | POA: Diagnosis not present

## 2018-03-07 DIAGNOSIS — Z01419 Encounter for gynecological examination (general) (routine) without abnormal findings: Secondary | ICD-10-CM | POA: Diagnosis not present

## 2018-06-07 DIAGNOSIS — E039 Hypothyroidism, unspecified: Secondary | ICD-10-CM | POA: Diagnosis not present

## 2018-06-07 DIAGNOSIS — R7301 Impaired fasting glucose: Secondary | ICD-10-CM | POA: Diagnosis not present

## 2018-06-07 DIAGNOSIS — E785 Hyperlipidemia, unspecified: Secondary | ICD-10-CM | POA: Diagnosis not present

## 2018-06-07 DIAGNOSIS — Z1159 Encounter for screening for other viral diseases: Secondary | ICD-10-CM | POA: Diagnosis not present

## 2018-06-07 DIAGNOSIS — Z0000 Encounter for general adult medical examination without abnormal findings: Secondary | ICD-10-CM | POA: Diagnosis not present

## 2018-12-08 DIAGNOSIS — R7303 Prediabetes: Secondary | ICD-10-CM | POA: Diagnosis not present

## 2018-12-08 DIAGNOSIS — F988 Other specified behavioral and emotional disorders with onset usually occurring in childhood and adolescence: Secondary | ICD-10-CM | POA: Diagnosis not present

## 2018-12-08 DIAGNOSIS — E039 Hypothyroidism, unspecified: Secondary | ICD-10-CM | POA: Diagnosis not present

## 2018-12-08 DIAGNOSIS — J45909 Unspecified asthma, uncomplicated: Secondary | ICD-10-CM | POA: Diagnosis not present

## 2018-12-08 DIAGNOSIS — Z6836 Body mass index (BMI) 36.0-36.9, adult: Secondary | ICD-10-CM | POA: Diagnosis not present

## 2018-12-16 DIAGNOSIS — D2272 Melanocytic nevi of left lower limb, including hip: Secondary | ICD-10-CM | POA: Diagnosis not present

## 2018-12-16 DIAGNOSIS — L400 Psoriasis vulgaris: Secondary | ICD-10-CM | POA: Diagnosis not present

## 2018-12-16 DIAGNOSIS — D2261 Melanocytic nevi of right upper limb, including shoulder: Secondary | ICD-10-CM | POA: Diagnosis not present

## 2018-12-16 DIAGNOSIS — D2271 Melanocytic nevi of right lower limb, including hip: Secondary | ICD-10-CM | POA: Diagnosis not present

## 2019-01-17 DIAGNOSIS — H1045 Other chronic allergic conjunctivitis: Secondary | ICD-10-CM | POA: Diagnosis not present

## 2019-02-13 DIAGNOSIS — Z23 Encounter for immunization: Secondary | ICD-10-CM | POA: Diagnosis not present

## 2019-02-13 DIAGNOSIS — L299 Pruritus, unspecified: Secondary | ICD-10-CM | POA: Diagnosis not present

## 2019-02-13 DIAGNOSIS — L0390 Cellulitis, unspecified: Secondary | ICD-10-CM | POA: Diagnosis not present

## 2019-02-13 DIAGNOSIS — Z6836 Body mass index (BMI) 36.0-36.9, adult: Secondary | ICD-10-CM | POA: Diagnosis not present

## 2019-02-15 DIAGNOSIS — L0390 Cellulitis, unspecified: Secondary | ICD-10-CM | POA: Diagnosis not present

## 2019-02-15 DIAGNOSIS — L299 Pruritus, unspecified: Secondary | ICD-10-CM | POA: Diagnosis not present

## 2019-02-15 DIAGNOSIS — Z6836 Body mass index (BMI) 36.0-36.9, adult: Secondary | ICD-10-CM | POA: Diagnosis not present

## 2019-03-31 DIAGNOSIS — J45909 Unspecified asthma, uncomplicated: Secondary | ICD-10-CM | POA: Diagnosis not present

## 2019-03-31 DIAGNOSIS — E039 Hypothyroidism, unspecified: Secondary | ICD-10-CM | POA: Diagnosis not present

## 2019-03-31 DIAGNOSIS — F418 Other specified anxiety disorders: Secondary | ICD-10-CM | POA: Diagnosis not present

## 2019-03-31 DIAGNOSIS — R7303 Prediabetes: Secondary | ICD-10-CM | POA: Diagnosis not present

## 2019-05-15 DIAGNOSIS — Z6836 Body mass index (BMI) 36.0-36.9, adult: Secondary | ICD-10-CM | POA: Diagnosis not present

## 2019-05-15 DIAGNOSIS — Z1231 Encounter for screening mammogram for malignant neoplasm of breast: Secondary | ICD-10-CM | POA: Diagnosis not present

## 2019-05-15 DIAGNOSIS — Z124 Encounter for screening for malignant neoplasm of cervix: Secondary | ICD-10-CM | POA: Diagnosis not present

## 2019-05-15 DIAGNOSIS — Z01419 Encounter for gynecological examination (general) (routine) without abnormal findings: Secondary | ICD-10-CM | POA: Diagnosis not present

## 2019-05-17 ENCOUNTER — Other Ambulatory Visit: Payer: Self-pay | Admitting: Obstetrics & Gynecology

## 2019-05-17 DIAGNOSIS — R928 Other abnormal and inconclusive findings on diagnostic imaging of breast: Secondary | ICD-10-CM

## 2019-05-18 ENCOUNTER — Ambulatory Visit
Admission: RE | Admit: 2019-05-18 | Discharge: 2019-05-18 | Disposition: A | Discharge status: Home / Self Care | Payer: BLUE CROSS/BLUE SHIELD | Source: Ambulatory Visit | Attending: Obstetrics & Gynecology | Admitting: Obstetrics & Gynecology

## 2019-05-18 ENCOUNTER — Ambulatory Visit
Admission: RE | Admit: 2019-05-18 | Discharge: 2019-05-18 | Disposition: A | Discharge status: Home / Self Care | Payer: BC Managed Care – PPO | Source: Ambulatory Visit | Attending: Obstetrics & Gynecology | Admitting: Obstetrics & Gynecology

## 2019-05-18 ENCOUNTER — Other Ambulatory Visit: Payer: Self-pay

## 2019-05-18 DIAGNOSIS — N6001 Solitary cyst of right breast: Secondary | ICD-10-CM | POA: Diagnosis not present

## 2019-05-18 DIAGNOSIS — R928 Other abnormal and inconclusive findings on diagnostic imaging of breast: Secondary | ICD-10-CM

## 2019-05-18 DIAGNOSIS — R922 Inconclusive mammogram: Secondary | ICD-10-CM | POA: Diagnosis not present

## 2019-06-13 DIAGNOSIS — J45909 Unspecified asthma, uncomplicated: Secondary | ICD-10-CM | POA: Diagnosis not present

## 2019-06-13 DIAGNOSIS — E039 Hypothyroidism, unspecified: Secondary | ICD-10-CM | POA: Diagnosis not present

## 2019-06-13 DIAGNOSIS — Z13220 Encounter for screening for lipoid disorders: Secondary | ICD-10-CM | POA: Diagnosis not present

## 2019-06-13 DIAGNOSIS — R7303 Prediabetes: Secondary | ICD-10-CM | POA: Diagnosis not present

## 2019-06-13 DIAGNOSIS — Z0000 Encounter for general adult medical examination without abnormal findings: Secondary | ICD-10-CM | POA: Diagnosis not present

## 2020-06-25 IMAGING — US ULTRASOUND RIGHT BREAST LIMITED
1 series · 7 of 7 positions shown · non-contrast
Comparison: Previous exam(s).

CLINICAL DATA: Screening recall for possible right breast mass.

EXAM:
DIGITAL DIAGNOSTIC RIGHT MAMMOGRAM WITH CAD AND TOMO
ULTRASOUND RIGHT BREAST

[Series 1: ultrasound right breast limited · 0.06mm/px · 7 of 7 slices shown]
[im 1/7]
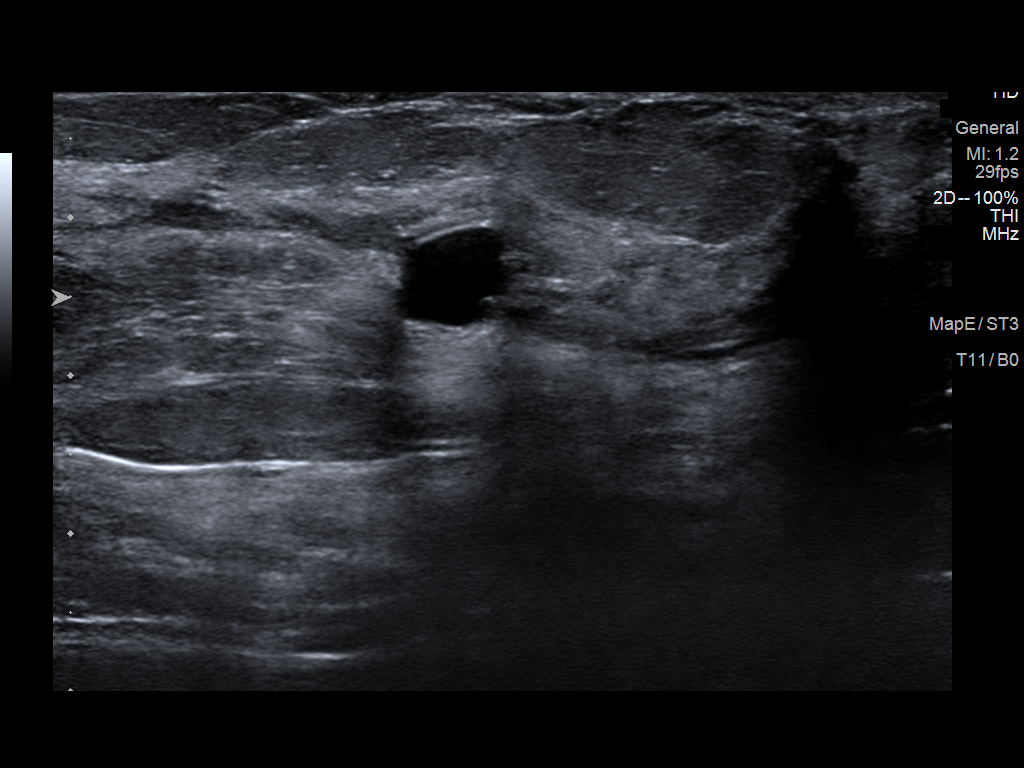
[im 2/7]
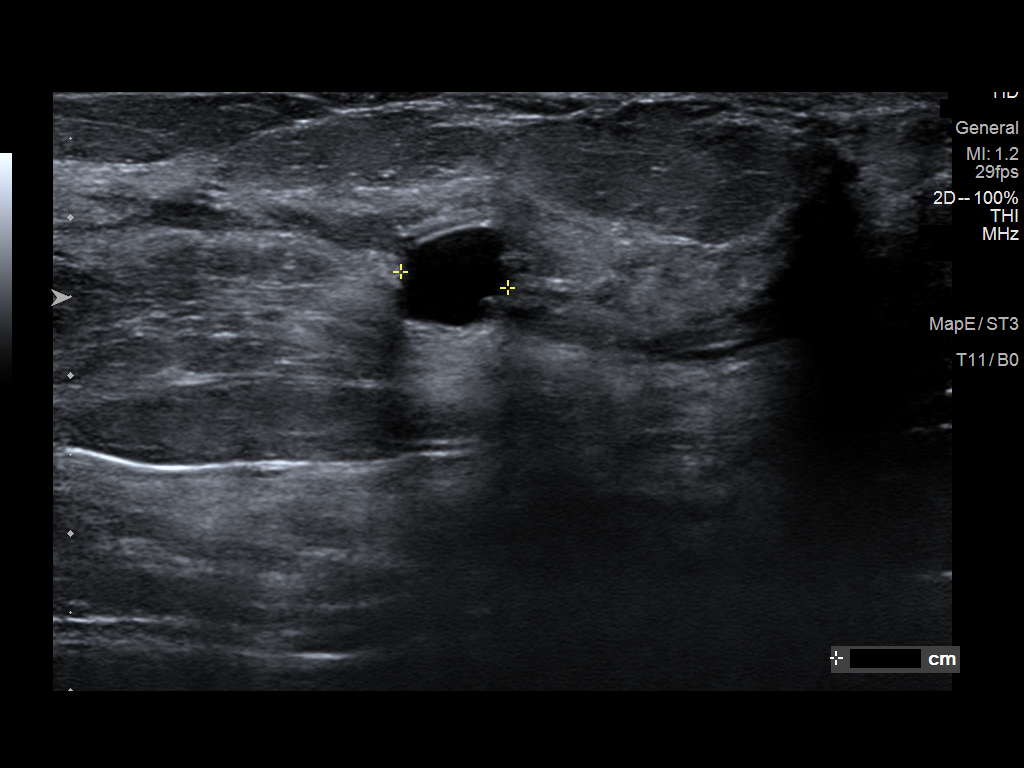
[im 3/7]
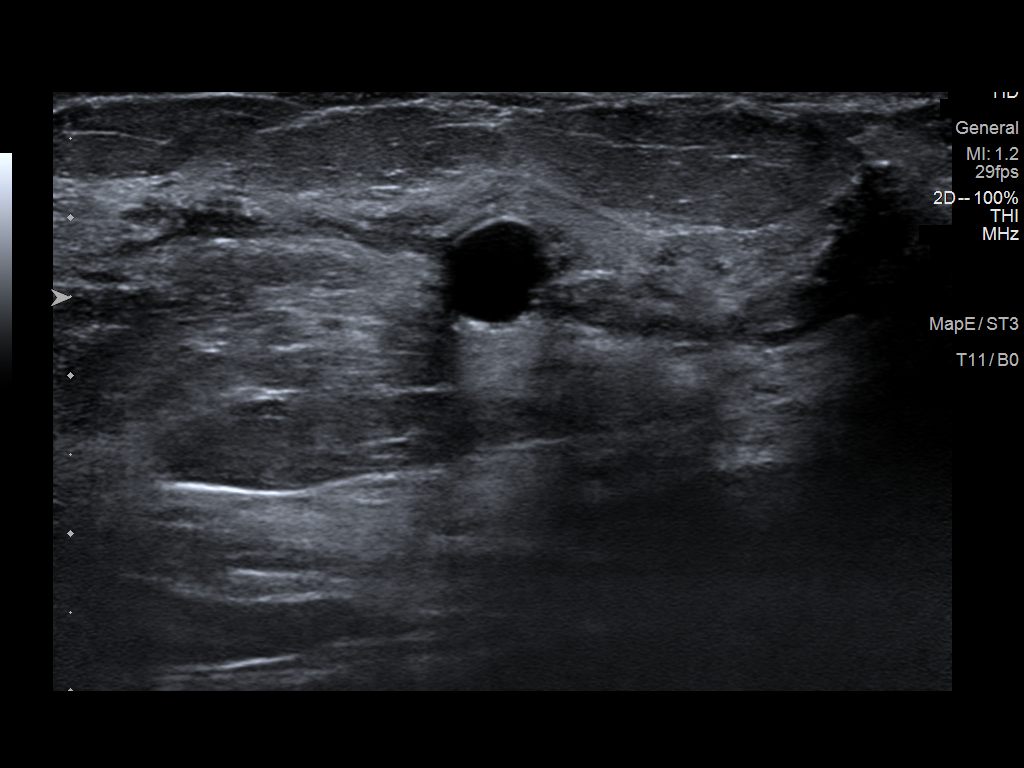
[im 4/7]
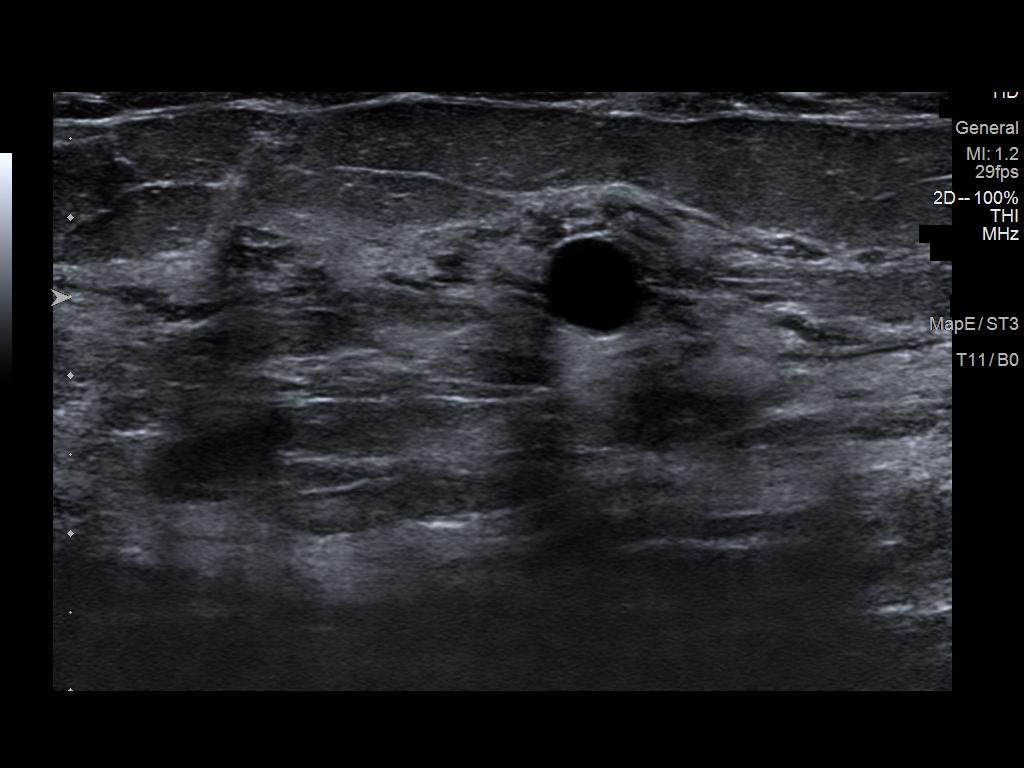
[im 5/7]
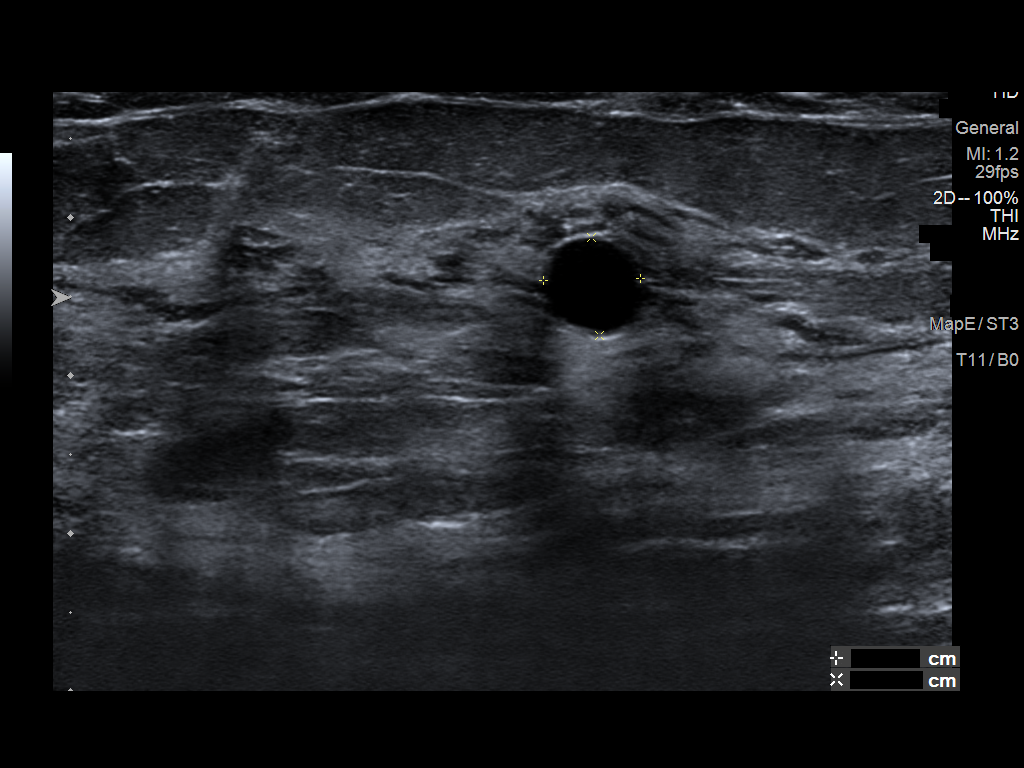
[im 6/7]
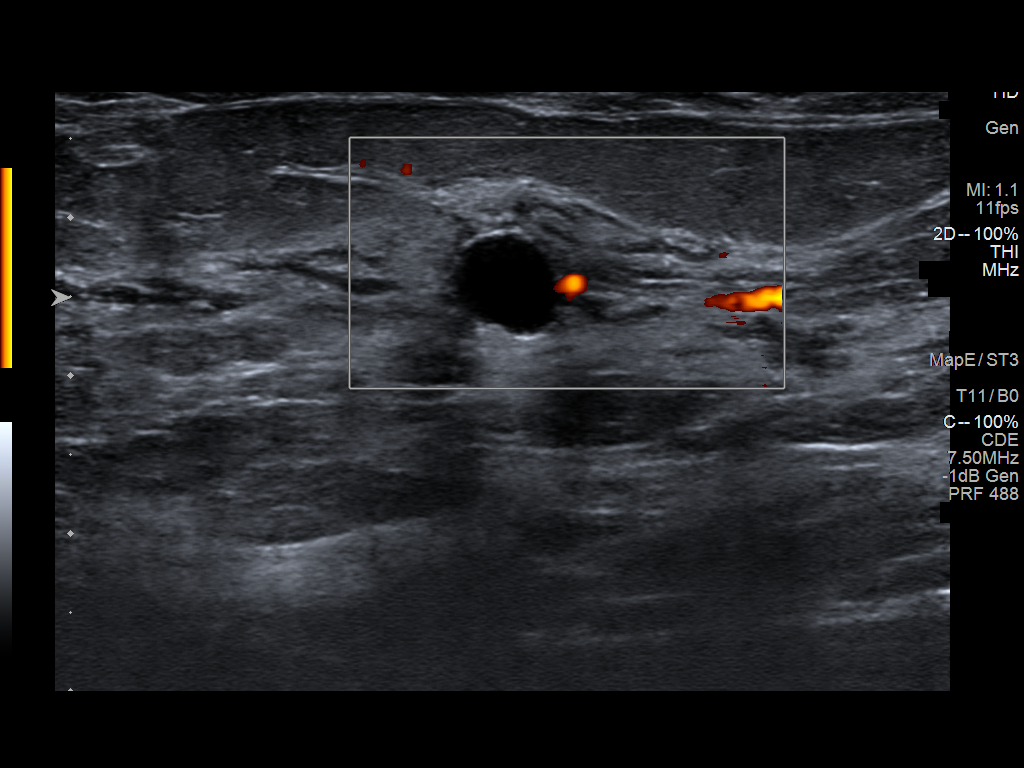
[im 7/7]
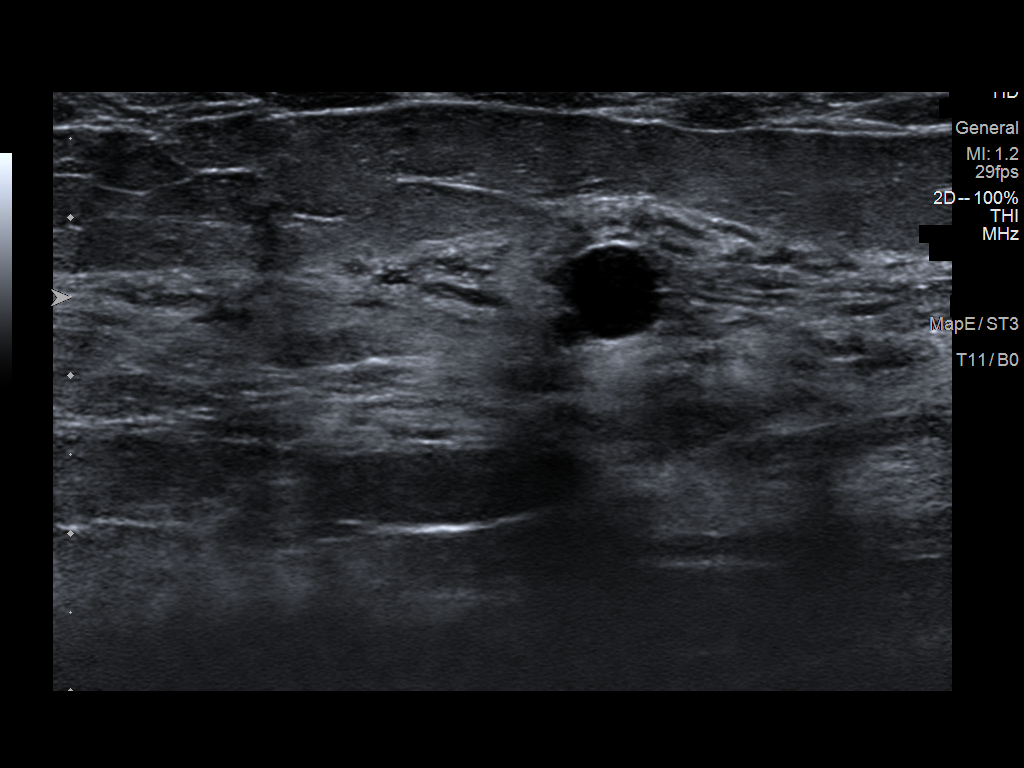

[7 of 7 positions shown; findings below may reference images not displayed]

ACR Breast Density Category c: The breast tissue is heterogeneously
dense, which may obscure small masses.
FINDINGS: The possible mass noted in the upper anterior right breast persists
on the diagnostic spot-compression images. It is oval, mostly
circumscribed and relatively lucent. Measuring approximately 7 mm in
long axis.

Mammographic images were processed with CAD.

Targeted ultrasound is performed, showing a simple round cyst in the
right breast at 11:30 o'clock, 2 cm the nipple, anterior depth,
measuring 7 x 6 x 6 mm, consistent in size, shape and location to
the mammographic finding. No solid masses or suspicious lesions.
IMPRESSION: 1. No evidence of breast malignancy.
2. Benign right breast cyst.

RECOMMENDATION:
Screening mammogram in one year.(Code:[id number]-W-MTE)

I have discussed the findings and recommendations with the patient.
Results were also provided in writing at the conclusion of the
visit. If applicable, a reminder letter will be sent to the patient
regarding the next appointment.

BI-RADS CATEGORY  2: Benign.

## 2020-06-25 IMAGING — MG DIGITAL DIAGNOSTIC UNILATERAL RIGHT MAMMOGRAM WITH TOMO AND CAD
4 series · 4 of 12 positions shown · non-contrast
Comparison: Previous exam(s).

CLINICAL DATA: Screening recall for possible right breast mass.

EXAM:
DIGITAL DIAGNOSTIC RIGHT MAMMOGRAM WITH CAD AND TOMO
ULTRASOUND RIGHT BREAST

[R CC synth-2D]
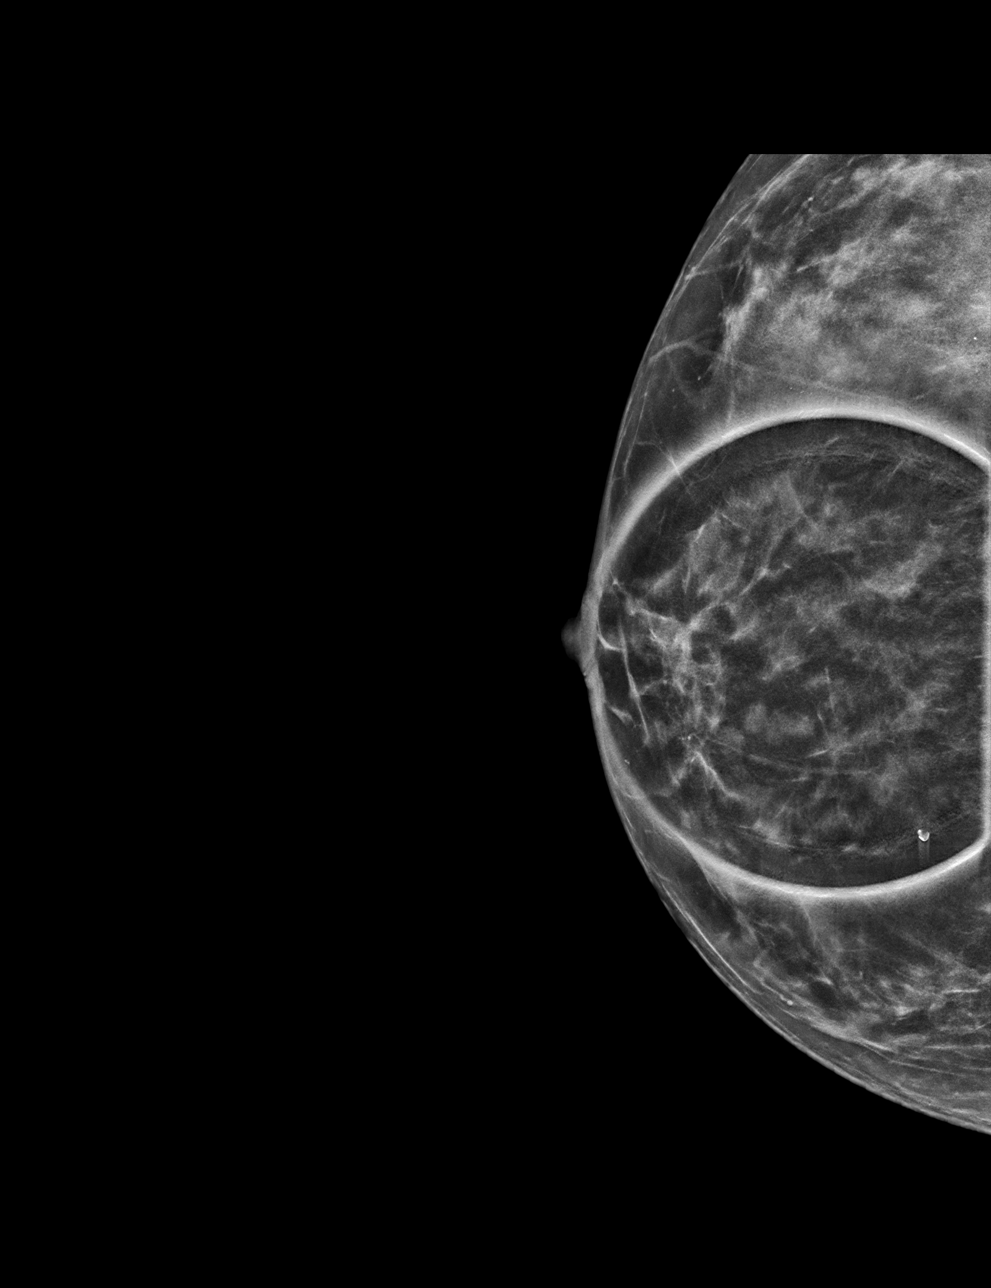

[R MLO synth-2D]
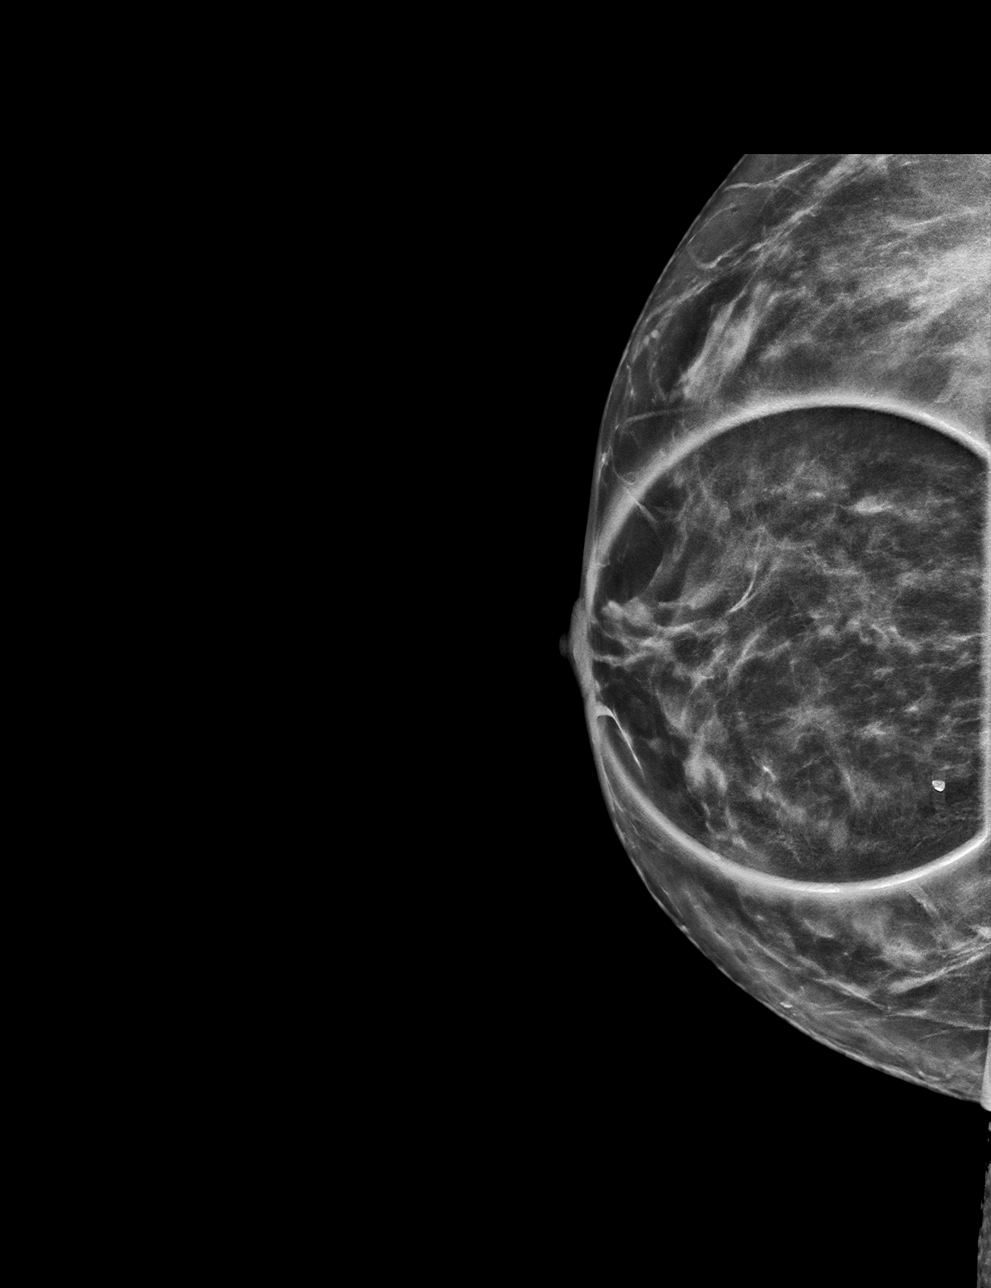

[R MLO tomo · tomo slice 31/62.0]
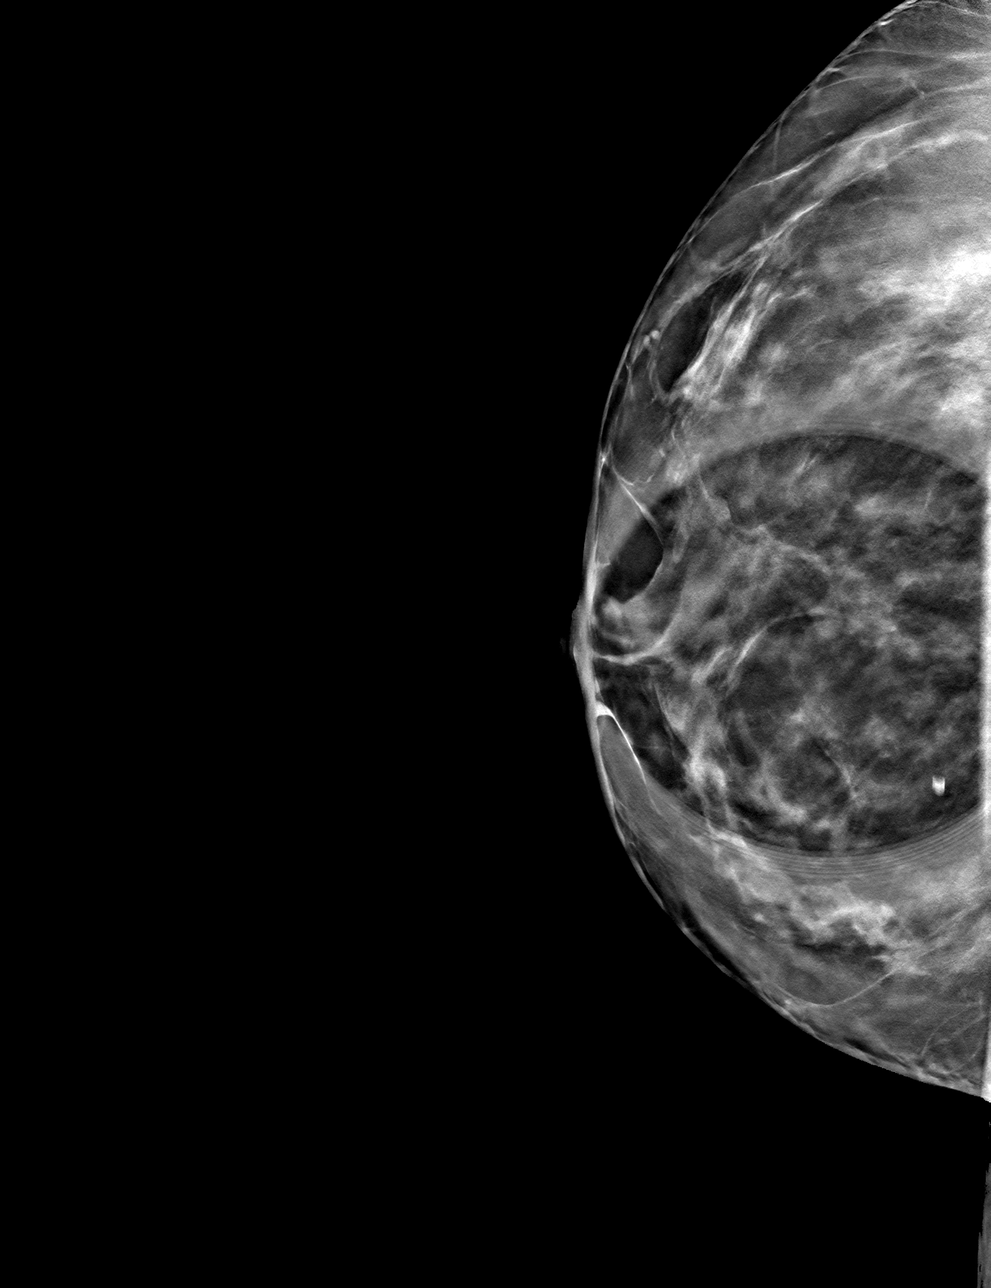

[R CC tomo · tomo slice 27/54.0]
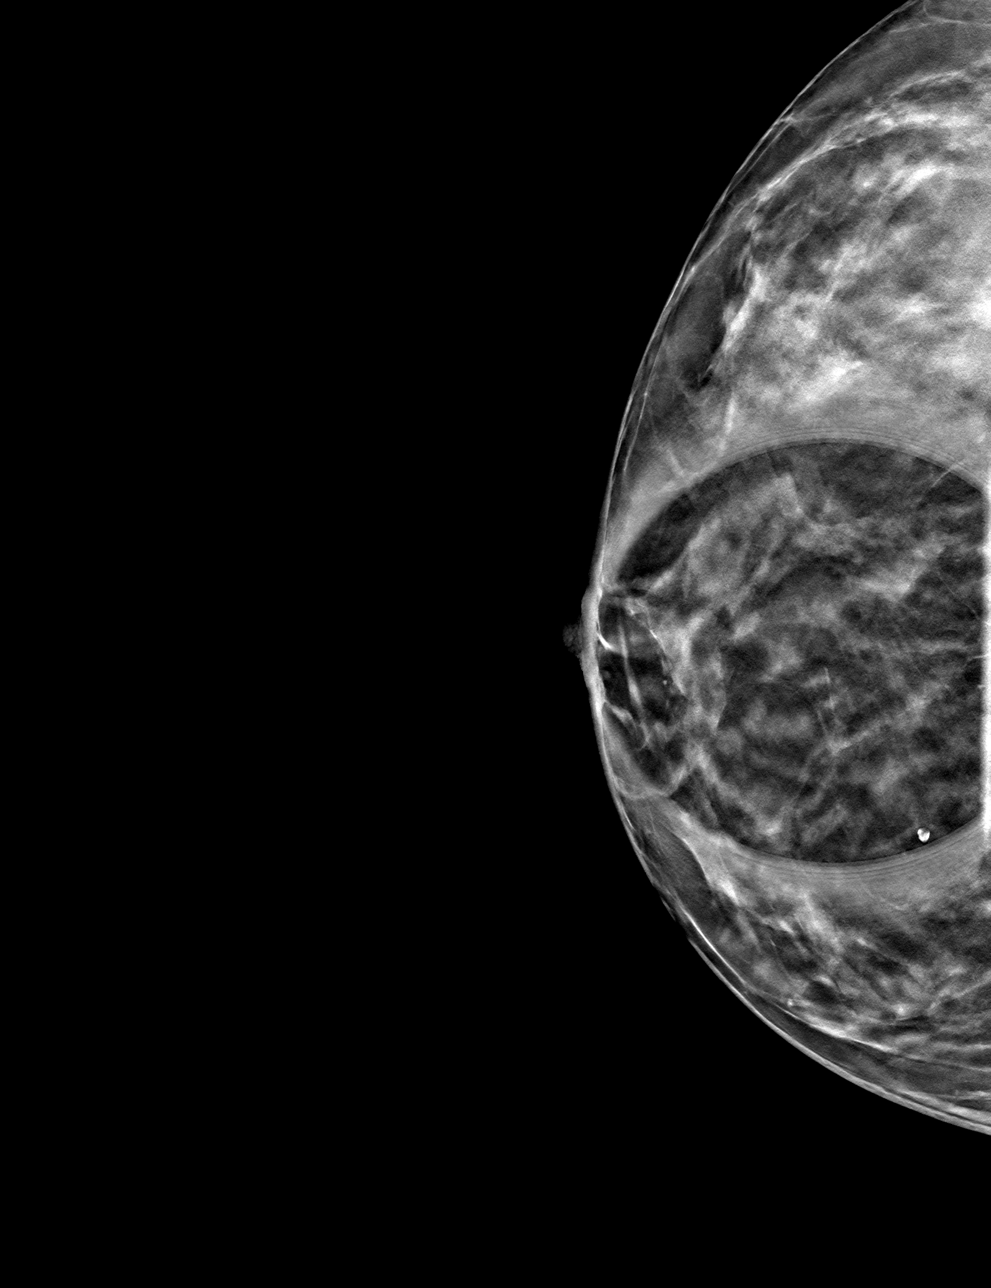

[4 of 12 positions shown; findings below may reference images not displayed]

ACR Breast Density Category c: The breast tissue is heterogeneously
dense, which may obscure small masses.
FINDINGS: The possible mass noted in the upper anterior right breast persists
on the diagnostic spot-compression images. It is oval, mostly
circumscribed and relatively lucent. Measuring approximately 7 mm in
long axis.

Mammographic images were processed with CAD.

Targeted ultrasound is performed, showing a simple round cyst in the
right breast at 11:30 o'clock, 2 cm the nipple, anterior depth,
measuring 7 x 6 x 6 mm, consistent in size, shape and location to
the mammographic finding. No solid masses or suspicious lesions.
IMPRESSION: 1. No evidence of breast malignancy.
2. Benign right breast cyst.

RECOMMENDATION:
Screening mammogram in one year.(Code:[id number]-[id number])

I have discussed the findings and recommendations with the patient.
Results were also provided in writing at the conclusion of the
visit. If applicable, a reminder letter will be sent to the patient
regarding the next appointment.

BI-RADS CATEGORY  2: Benign.

## 2021-01-27 ENCOUNTER — Other Ambulatory Visit: Payer: Self-pay | Admitting: Orthopedic Surgery

## 2021-01-27 DIAGNOSIS — M25511 Pain in right shoulder: Secondary | ICD-10-CM

## 2021-02-03 ENCOUNTER — Ambulatory Visit
Admission: RE | Admit: 2021-02-03 | Discharge: 2021-02-03 | Disposition: A | Discharge status: Home / Self Care | Payer: No Typology Code available for payment source | Source: Ambulatory Visit | Attending: Orthopedic Surgery | Admitting: Orthopedic Surgery

## 2021-02-03 DIAGNOSIS — M25511 Pain in right shoulder: Secondary | ICD-10-CM

## 2022-06-22 ENCOUNTER — Other Ambulatory Visit: Payer: Self-pay | Admitting: Obstetrics & Gynecology

## 2022-06-22 DIAGNOSIS — R928 Other abnormal and inconclusive findings on diagnostic imaging of breast: Secondary | ICD-10-CM

## 2022-06-30 ENCOUNTER — Ambulatory Visit
Admission: RE | Admit: 2022-06-30 | Discharge: 2022-06-30 | Disposition: A | Discharge status: Home / Self Care | Payer: No Typology Code available for payment source | Source: Ambulatory Visit | Attending: Obstetrics & Gynecology | Admitting: Obstetrics & Gynecology

## 2022-06-30 DIAGNOSIS — R928 Other abnormal and inconclusive findings on diagnostic imaging of breast: Secondary | ICD-10-CM

## 2022-12-15 ENCOUNTER — Other Ambulatory Visit: Payer: Self-pay | Admitting: Orthopaedic Surgery

## 2022-12-15 DIAGNOSIS — Z01818 Encounter for other preprocedural examination: Secondary | ICD-10-CM

## 2023-01-11 ENCOUNTER — Ambulatory Visit
Admission: RE | Admit: 2023-01-11 | Discharge: 2023-01-11 | Disposition: A | Discharge status: Home / Self Care | Payer: No Typology Code available for payment source | Source: Ambulatory Visit | Attending: Orthopaedic Surgery

## 2023-01-11 DIAGNOSIS — Z01818 Encounter for other preprocedural examination: Secondary | ICD-10-CM
